# Patient Record
Sex: Male | Born: 1985 | Race: White | Hispanic: No | Marital: Single | Smoking: Current every day smoker
Health system: Southern US, Community
[De-identification: ages and names within clinical notes are randomized; demographics above are authoritative.]

---

## 2012-02-21 ENCOUNTER — Emergency Department (HOSPITAL_COMMUNITY): Payer: No Typology Code available for payment source

## 2012-02-21 ENCOUNTER — Encounter (HOSPITAL_COMMUNITY): Payer: Self-pay | Admitting: Emergency Medicine

## 2012-02-21 ENCOUNTER — Emergency Department (HOSPITAL_COMMUNITY)
Admission: EM | Admit: 2012-02-21 | Discharge: 2012-02-21 | Disposition: A | Discharge status: Home / Self Care | Payer: No Typology Code available for payment source | Attending: Emergency Medicine | Admitting: Emergency Medicine

## 2012-02-21 DIAGNOSIS — R109 Unspecified abdominal pain: Secondary | ICD-10-CM | POA: Insufficient documentation

## 2012-02-21 DIAGNOSIS — M542 Cervicalgia: Secondary | ICD-10-CM | POA: Insufficient documentation

## 2012-02-21 DIAGNOSIS — Y92410 Unspecified street and highway as the place of occurrence of the external cause: Secondary | ICD-10-CM | POA: Insufficient documentation

## 2012-02-21 DIAGNOSIS — M79609 Pain in unspecified limb: Secondary | ICD-10-CM | POA: Insufficient documentation

## 2012-02-21 DIAGNOSIS — R51 Headache: Secondary | ICD-10-CM | POA: Insufficient documentation

## 2012-02-21 MED ORDER — HYDROCODONE-ACETAMINOPHEN 5-325 MG PO TABS: 1.0000 | ORAL_TABLET | Freq: Four times a day (QID) | ORAL | Status: AC | PRN

## 2012-02-21 MED ORDER — IBUPROFEN 200 MG PO TABS
600.0000 mg | ORAL_TABLET | Freq: Once | ORAL | Status: AC
  Administered 2012-02-21: 600 mg via ORAL
  Filled 2012-02-21: qty 3

## 2012-02-21 MED ORDER — OXYCODONE-ACETAMINOPHEN 5-325 MG PO TABS
1.0000 | ORAL_TABLET | Freq: Once | ORAL | Status: AC
  Administered 2012-02-21: 1 via ORAL
  Filled 2012-02-21: qty 1

## 2012-02-21 MED ORDER — IOHEXOL 300 MG/ML  SOLN
100.0000 mL | Freq: Once | INTRAMUSCULAR | Status: AC | PRN
  Administered 2012-02-21: 100 mL via INTRAVENOUS

## 2012-02-21 MED ORDER — DIAZEPAM 5 MG PO TABS: 5.0000 mg | ORAL_TABLET | Freq: Three times a day (TID) | ORAL | Status: AC | PRN

## 2012-02-21 MED ORDER — IBUPROFEN 800 MG PO TABS: 800.0000 mg | ORAL_TABLET | Freq: Three times a day (TID) | ORAL | Status: AC

## 2012-02-21 MED ORDER — DIAZEPAM 5 MG PO TABS
5.0000 mg | ORAL_TABLET | Freq: Once | ORAL | Status: AC
  Administered 2012-02-21: 5 mg via ORAL
  Filled 2012-02-21: qty 1
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
## 2012-02-21 NOTE — ED Notes (Signed)
Pt ambulates without distress.  
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
## 2012-02-22 NOTE — ED Provider Notes (Signed)
Medical screening examination/treatment/procedure(s) were performed by non-physician practitioner and as supervising physician I was immediately available for consultation/collaboration.  Geoffery Lyons, MD 02/22/12 [phone]

## 2017-05-07 ENCOUNTER — Emergency Department (HOSPITAL_COMMUNITY)
Admission: EM | Admit: 2017-05-07 | Discharge: 2017-05-07 | Disposition: A | Discharge status: Home / Self Care | Payer: Self-pay | Attending: Emergency Medicine | Admitting: Emergency Medicine

## 2017-05-07 ENCOUNTER — Encounter (HOSPITAL_COMMUNITY): Payer: Self-pay | Admitting: *Deleted

## 2017-05-07 ENCOUNTER — Emergency Department (HOSPITAL_COMMUNITY): Payer: Self-pay

## 2017-05-07 DIAGNOSIS — K5792 Diverticulitis of intestine, part unspecified, without perforation or abscess without bleeding: Secondary | ICD-10-CM

## 2017-05-07 DIAGNOSIS — K5732 Diverticulitis of large intestine without perforation or abscess without bleeding: Secondary | ICD-10-CM | POA: Insufficient documentation

## 2017-05-07 DIAGNOSIS — F17210 Nicotine dependence, cigarettes, uncomplicated: Secondary | ICD-10-CM | POA: Insufficient documentation

## 2017-05-07 LAB — CBC
HCT: 44.3 % (ref 39.0–52.0)
Hemoglobin: 15.5 g/dL (ref 13.0–17.0)
MCH: 30 pg (ref 26.0–34.0)
MCHC: 35 g/dL (ref 30.0–36.0)
MCV: 85.9 fL (ref 78.0–100.0)
Platelets: 225 10*3/uL (ref 150–400)
RBC: 5.16 MIL/uL (ref 4.22–5.81)
RDW: 12.8 % (ref 11.5–15.5)
WBC: 10.9 10*3/uL — ABNORMAL HIGH (ref 4.0–10.5)

## 2017-05-07 LAB — BASIC METABOLIC PANEL
Anion gap: 7 (ref 5–15)
BUN: 10 mg/dL (ref 6–20)
CO2: 25 mmol/L (ref 22–32)
Calcium: 9.4 mg/dL (ref 8.9–10.3)
Chloride: 105 mmol/L (ref 101–111)
Creatinine, Ser: 1.04 mg/dL (ref 0.61–1.24)
GFR calc Af Amer: >60 mL/min (ref >60)
GFR calc non Af Amer: >60 mL/min (ref >60)
Glucose, Bld: 109 mg/dL — ABNORMAL HIGH (ref 65–99)
Potassium: 3.6 mmol/L (ref 3.5–5.1)
Sodium: 137 mmol/L (ref 135–145)

## 2017-05-07 MED ORDER — DIPHENHYDRAMINE HCL 50 MG/ML IJ SOLN
25.0000 mg | Freq: Once | INTRAMUSCULAR | Status: AC
  Administered 2017-05-07: 25 mg via INTRAVENOUS
  Filled 2017-05-07: qty 1

## 2017-05-07 MED ORDER — KETOROLAC TROMETHAMINE 30 MG/ML IJ SOLN
30.0000 mg | Freq: Once | INTRAMUSCULAR | Status: AC
  Administered 2017-05-07: 30 mg via INTRAVENOUS
  Filled 2017-05-07: qty 1

## 2017-05-07 MED ORDER — TRAMADOL HCL 50 MG PO TABS: 50.0000 mg | ORAL_TABLET | Freq: Four times a day (QID) | ORAL | 0 refills | Status: DC | PRN

## 2017-05-07 MED ORDER — HYDROMORPHONE HCL 1 MG/ML IJ SOLN
1.0000 mg | Freq: Once | INTRAMUSCULAR | Status: DC
Start: 2017-05-07 — End: 2017-05-07
  Filled 2017-05-07: qty 1

## 2017-05-07 MED ORDER — CIPROFLOXACIN IN D5W 400 MG/200ML IV SOLN
400.0000 mg | Freq: Once | INTRAVENOUS | Status: AC
  Administered 2017-05-07: 400 mg via INTRAVENOUS
  Filled 2017-05-07: qty 200

## 2017-05-07 MED ORDER — METRONIDAZOLE IN NACL 5-0.79 MG/ML-% IV SOLN
500.0000 mg | Freq: Once | INTRAVENOUS | Status: DC
  Filled 2017-05-07: qty 100

## 2017-05-07 MED ORDER — IOPAMIDOL (ISOVUE-300) INJECTION 61%
INTRAVENOUS | Status: AC
  Filled 2017-05-07: qty 100

## 2017-05-07 MED ORDER — ONDANSETRON HCL 4 MG/2ML IJ SOLN
4.0000 mg | Freq: Once | INTRAMUSCULAR | Status: DC
  Filled 2017-05-07: qty 2

## 2017-05-07 MED ORDER — IOPAMIDOL (ISOVUE-300) INJECTION 61%
100.0000 mL | Freq: Once | INTRAVENOUS | Status: AC | PRN
  Administered 2017-05-07: 100 mL via INTRAVENOUS

## 2017-05-07 MED ORDER — AMOXICILLIN-POT CLAVULANATE 875-125 MG PO TABS: 1.0000 | ORAL_TABLET | Freq: Two times a day (BID) | ORAL | 0 refills | Status: DC

## 2017-05-07 NOTE — ED Notes (Signed)
Patient was alert, oriented and stable upon discharge. RN went over AVS and patient had no further questions.  

## 2017-05-07 NOTE — ED Notes (Signed)
ED Provider at bedside. 

## 2017-05-07 NOTE — ED Triage Notes (Signed)
Patient is alert and oriented x4.  He is being seen for right flank pain that started a week ago and progressively has gotten worse.  Patient denies any fevers, nausea or vomting.  Currently he rates his pain 10 of 10.

## 2017-05-07 NOTE — Discharge Instructions (Signed)
Return here as needed.  Follow-up with the, Dr. provided °

## 2017-05-07 NOTE — ED Notes (Signed)
Redness and hives noted above IV site. Cipro stopped. PA notified. No airway involvement.

## 2017-05-07 NOTE — ED Notes (Signed)
Pt refused meds stating he "doesn't want to go to sleep." PA made aware. Pt transported to CT

## 2017-05-07 NOTE — ED Notes (Signed)
Patient was reminded that a urine sample was requested.

## 2017-05-09 NOTE — ED Provider Notes (Signed)
[facility]-EMERGENCY DEPT Provider Note   CSN: [id number] Arrival date & time: 05/07/17  0734     History   Chief Complaint Chief Complaint  Patient presents with  . Flank Pain    HPI Thomas Barnett is a 31 y.o. male.  HPI Patient presents to the emergency department with right flank pain that started one week ago.  The patient states he seems to have had worsening pain over that time.  Patient states that he does not think he had any fevers.  He states he did not take any medications prior to arrival.  States that he has never had any pain similar to this in the past.  Patient states he does have some abdominal pain along the lower part of his abdomen as well both on the right in the left. The patient denies chest pain, shortness of breath, headache,blurred vision, neck pain, fever, cough, weakness, numbness, dizziness, anorexia, edema,  nausea, vomiting, diarrhea, rash, back pain, dysuria, hematemesis, bloody stool, near syncope, or syncope. History reviewed. No pertinent past medical history.  There are no active problems to display for this patient.   History reviewed. No pertinent surgical history.     Home Medications    Prior to Admission medications   Medication Sig Start Date End Date Taking? Authorizing Provider  Aspirin-Acetaminophen-Caffeine (GOODY HEADACHE PO) Take 1 packet by mouth daily as needed (pain).   Yes [provider]  ibuprofen (ADVIL,MOTRIN) 200 MG tablet Take 600-800 mg by mouth every 4 (four) hours as needed for moderate pain.   Yes [provider]  amoxicillin-clavulanate (AUGMENTIN) 875-125 MG tablet Take 1 tablet by mouth every 12 (twelve) hours. 05/07/17   Cedricka Sackrider, Cristal Deer, PA-C  traMADol (ULTRAM) 50 MG tablet Take 1 tablet (50 mg total) by mouth every 6 (six) hours as needed for severe pain. 05/07/17   Charlestine Night, PA-C    Family History No family history on file.  Social History Social History  Substance Use Topics  .  Smoking status: Current Every Day Smoker    Packs/day: 0.50    Types: Cigarettes  . Smokeless tobacco: Never Used  . Alcohol use Yes     Allergies   Ciprofloxacin   Review of Systems Review of Systems All other systems negative except as documented in the HPI. All pertinent positives and negatives as reviewed in the HPI.  Physical Exam Updated Vital Signs BP 117/70 (BP Location: Right Arm)   Pulse (!) 56   Temp 98.1 F (36.7 C) (Oral)   Resp 16   Ht 5\' 6"  (1.676 m)   Wt 65.8 kg (145 lb)   SpO2 98%   BMI 23.40 kg/m   Physical Exam  Constitutional: He is oriented to person, place, and time. He appears well-developed and well-nourished. No distress.  HENT:  Head: Normocephalic and atraumatic.  Mouth/Throat: Oropharynx is clear and moist.  Eyes: Pupils are equal, round, and reactive to light.  Neck: Normal range of motion. Neck supple.  Cardiovascular: Normal rate, regular rhythm and normal heart sounds.  Exam reveals no gallop and no friction rub.   No murmur heard. Pulmonary/Chest: Effort normal and breath sounds normal. No respiratory distress. He has no wheezes.  Abdominal: Soft. Bowel sounds are normal. He exhibits no distension and no mass. There is tenderness. There is no rebound and no guarding.    Neurological: He is alert and oriented to person, place, and time. He exhibits normal muscle tone. Coordination normal.  Skin: Skin is warm and  dry. Capillary refill takes less than 2 seconds. No rash noted. No erythema.  Psychiatric: He has a normal mood and affect. His behavior is normal.  Nursing note and vitals reviewed.    ED Treatments / Results  Labs (all labs ordered are listed, but only abnormal results are displayed) Labs Reviewed  BASIC METABOLIC PANEL - Abnormal; Notable for the following:       Result Value   Glucose, Bld 109 (*)    All other components within normal limits  CBC - Abnormal; Notable for the following:    WBC 10.9 (*)    All other  components within normal limits    EKG  EKG Interpretation None       Radiology No results found.  Procedures Procedures (including critical care time)  Medications Ordered in ED Medications  iopamidol (ISOVUE-300) 61 % injection 100 mL (100 mLs Intravenous Contrast Given 05/07/17 0921)  ketorolac (TORADOL) 30 MG/ML injection 30 mg (30 mg Intravenous Given 05/07/17 1155)  ciprofloxacin (CIPRO) IVPB 400 mg (0 mg Intravenous Stopped 05/07/17 1210)  diphenhydrAMINE (BENADRYL) injection 25 mg (25 mg Intravenous Given 05/07/17 1215)     Initial Impression / Assessment and Plan / ED Course  I have reviewed the triage vital signs and the nursing notes.  Pertinent labs & imaging results that were available during my care of the patient were reviewed by me and considered in my medical decision making (see chart for details).     Patient has findings consistent with early diverticulitis.  There is no sign of abscess or perforation.  The patient will be started on antibiotics.  He is given fluids here in the emergency department.  Patient would like to be discharged home.  I did given follow-up with GI for further evaluation and care.  Patient is advised to return here for any worsening in his condition  Final Clinical Impressions(s) / ED Diagnoses   Final diagnoses:  Diverticulitis    New Prescriptions Discharge Medication List as of 05/07/2017 12:31 PM    START taking these medications   Details  amoxicillin-clavulanate (AUGMENTIN) 875-125 MG tablet Take 1 tablet by mouth every 12 (twelve) hours., Starting Fri 05/07/2017, Print    traMADol (ULTRAM) 50 MG tablet Take 1 tablet (50 mg total) by mouth every 6 (six) hours as needed for severe pain., Starting Fri 05/07/2017, Print         Baraa Tubbs, [place], PA-C 05/09/17 1625    Raeford Razor, MD 05/18/17 [phone]

## 2019-11-19 ENCOUNTER — Encounter (HOSPITAL_COMMUNITY): Payer: Self-pay

## 2019-11-19 ENCOUNTER — Emergency Department (HOSPITAL_COMMUNITY)
Admission: EM | Admit: 2019-11-19 | Discharge: 2019-11-19 | Disposition: A | Discharge status: Home / Self Care | Payer: Self-pay | Attending: Emergency Medicine | Admitting: Emergency Medicine

## 2019-11-19 ENCOUNTER — Other Ambulatory Visit: Payer: Self-pay

## 2019-11-19 DIAGNOSIS — F17210 Nicotine dependence, cigarettes, uncomplicated: Secondary | ICD-10-CM | POA: Insufficient documentation

## 2019-11-19 DIAGNOSIS — M25572 Pain in left ankle and joints of left foot: Secondary | ICD-10-CM | POA: Insufficient documentation

## 2019-11-19 DIAGNOSIS — M25552 Pain in left hip: Secondary | ICD-10-CM | POA: Insufficient documentation
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
## 2020-10-09 ENCOUNTER — Ambulatory Visit (HOSPITAL_COMMUNITY)
Admission: EM | Admit: 2020-10-09 | Discharge: 2020-10-09 | Discharge status: Against Medical Advice | Payer: No Typology Code available for payment source

## 2020-10-09 ENCOUNTER — Other Ambulatory Visit: Payer: Self-pay

## 2020-10-09 ENCOUNTER — Ambulatory Visit
Admission: EM | Admit: 2020-10-09 | Discharge: 2020-10-09 | Disposition: A | Discharge status: Home / Self Care | Payer: Self-pay | Attending: Emergency Medicine | Admitting: Emergency Medicine

## 2020-10-09 ENCOUNTER — Encounter: Payer: Self-pay | Admitting: Emergency Medicine

## 2020-10-09 DIAGNOSIS — M545 Low back pain, unspecified: Secondary | ICD-10-CM

## 2020-10-09 DIAGNOSIS — R1030 Lower abdominal pain, unspecified: Secondary | ICD-10-CM

## 2020-10-09 MED ORDER — TIZANIDINE HCL 4 MG PO TABS: 4.0000 mg | ORAL_TABLET | Freq: Four times a day (QID) | ORAL | 0 refills | Status: AC | PRN

## 2020-10-09 MED ORDER — IBUPROFEN 800 MG PO TABS: 800.0000 mg | ORAL_TABLET | Freq: Three times a day (TID) | ORAL | 0 refills | Status: AC
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
## 2020-10-09 NOTE — Discharge Instructions (Signed)
Use anti-inflammatories for pain/swelling. You may take up to 800 mg Ibuprofen every 8 hours with food. You may supplement Ibuprofen with Tylenol [phone] mg every 8 hours.   You may use tizanidine as needed to help with pain. This is a muscle relaxer and causes sedation- please use only at bedtime or when you will be home and not have to drive/work  Alternate ice and heat  Gentle stretching  Follow-up if not improving or worsening
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
## 2020-10-09 NOTE — ED Notes (Signed)
Patient approached desk and asked to be taken out of system due to wait time.

## 2021-04-02 ENCOUNTER — Emergency Department (HOSPITAL_COMMUNITY): Payer: Self-pay

## 2021-04-02 ENCOUNTER — Emergency Department (HOSPITAL_COMMUNITY)
Admission: EM | Admit: 2021-04-02 | Discharge: 2021-04-02 | Discharge status: Against Medical Advice | Payer: Self-pay | Attending: Emergency Medicine | Admitting: Emergency Medicine

## 2021-04-02 ENCOUNTER — Encounter (HOSPITAL_COMMUNITY): Payer: Self-pay | Admitting: *Deleted

## 2021-04-02 ENCOUNTER — Other Ambulatory Visit: Payer: Self-pay

## 2021-04-02 DIAGNOSIS — K529 Noninfective gastroenteritis and colitis, unspecified: Secondary | ICD-10-CM | POA: Insufficient documentation

## 2021-04-02 DIAGNOSIS — F17210 Nicotine dependence, cigarettes, uncomplicated: Secondary | ICD-10-CM | POA: Insufficient documentation

## 2021-04-02 LAB — CBC WITH DIFFERENTIAL/PLATELET
Abs Immature Granulocytes: 0.04 10*3/uL (ref 0.00–0.07)
Basophils Absolute: 0 10*3/uL (ref 0.0–0.1)
Basophils Relative: 0 %
Eosinophils Absolute: 0 10*3/uL (ref 0.0–0.5)
Eosinophils Relative: 0 %
HCT: 46.9 % (ref 39.0–52.0)
Hemoglobin: 16.2 g/dL (ref 13.0–17.0)
Immature Granulocytes: 0 %
Lymphocytes Relative: 8 %
Lymphs Abs: 1.1 10*3/uL (ref 0.7–4.0)
MCH: 29.4 pg (ref 26.0–34.0)
MCHC: 34.5 g/dL (ref 30.0–36.0)
MCV: 85.1 fL (ref 80.0–100.0)
Monocytes Absolute: 0.4 10*3/uL (ref 0.1–1.0)
Monocytes Relative: 3 %
Neutro Abs: 13.4 10*3/uL — ABNORMAL HIGH (ref 1.7–7.7)
Neutrophils Relative %: 89 %
Platelets: 313 10*3/uL (ref 150–400)
RBC: 5.51 MIL/uL (ref 4.22–5.81)
RDW: 12.5 % (ref 11.5–15.5)
WBC: 15.1 10*3/uL — ABNORMAL HIGH (ref 4.0–10.5)
nRBC: 0 % (ref 0.0–0.2)

## 2021-04-02 LAB — COMPREHENSIVE METABOLIC PANEL
ALT: 31 U/L (ref 0–44)
AST: 31 U/L (ref 15–41)
Albumin: 4.5 g/dL (ref 3.5–5.0)
Alkaline Phosphatase: 75 U/L (ref 38–126)
Anion gap: 11 (ref 5–15)
BUN: 14 mg/dL (ref 6–20)
CO2: 24 mmol/L (ref 22–32)
Calcium: 9.5 mg/dL (ref 8.9–10.3)
Chloride: 102 mmol/L (ref 98–111)
Creatinine, Ser: 0.8 mg/dL (ref 0.61–1.24)
GFR, Estimated: >60 mL/min (ref >60)
Glucose, Bld: 110 mg/dL — ABNORMAL HIGH (ref 70–99)
Potassium: 3.6 mmol/L (ref 3.5–5.1)
Sodium: 137 mmol/L (ref 135–145)
Total Bilirubin: 0.7 mg/dL (ref 0.3–1.2)
Total Protein: 7.5 g/dL (ref 6.5–8.1)

## 2021-04-02 LAB — URINALYSIS, ROUTINE W REFLEX MICROSCOPIC
Bilirubin Urine: NEGATIVE
Glucose, UA: NEGATIVE mg/dL
Hgb urine dipstick: NEGATIVE
Ketones, ur: NEGATIVE mg/dL
Leukocytes,Ua: NEGATIVE
Nitrite: NEGATIVE
Protein, ur: NEGATIVE mg/dL
Specific Gravity, Urine: 1.006 (ref 1.005–1.030)
pH: 8 (ref 5.0–8.0)

## 2021-04-02 LAB — LIPASE, BLOOD: Lipase: 21 U/L (ref 11–51)

## 2021-04-02 MED ORDER — LORAZEPAM 2 MG/ML IJ SOLN
1.0000 mg | Freq: Once | INTRAMUSCULAR | Status: AC
  Administered 2021-04-02: 1 mg via INTRAVENOUS
  Filled 2021-04-02: qty 1

## 2021-04-02 MED ORDER — IOHEXOL 350 MG/ML SOLN
80.0000 mL | Freq: Once | INTRAVENOUS | Status: AC | PRN
  Administered 2021-04-02: 80 mL via INTRAVENOUS

## 2021-04-02 MED ORDER — ONDANSETRON HCL 4 MG/2ML IJ SOLN
4.0000 mg | Freq: Once | INTRAMUSCULAR | Status: AC
  Administered 2021-04-02: 4 mg via INTRAVENOUS
  Filled 2021-04-02: qty 2

## 2021-04-02 MED ORDER — SODIUM CHLORIDE 0.9 % IV SOLN: INTRAVENOUS | Status: DC

## 2021-04-02 MED ORDER — SODIUM CHLORIDE 0.9 % IV BOLUS
1000.0000 mL | Freq: Once | INTRAVENOUS | Status: AC
  Administered 2021-04-02: 1000 mL via INTRAVENOUS

## 2021-04-02 MED ORDER — SODIUM CHLORIDE (PF) 0.9 % IJ SOLN
INTRAMUSCULAR | Status: AC
  Filled 2021-04-02: qty 50

## 2021-04-02 NOTE — ED Triage Notes (Addendum)
Pt complains of vomiting and diarrhea. He reports going through opiate withdrawal. Last used heroin 5 days ago. He finished a detox program today.

## 2021-04-02 NOTE — ED Provider Notes (Addendum)
[facility]-EMERGENCY DEPT Provider Note   CSN: [id number] Arrival date & time: 04/02/21  1319     History Chief Complaint  Patient presents with   Emesis   Diarrhea   opiate withdrawal    Thomas Barnett is a 35 y.o. male.  Patient just spent 5 days in our Detox center for opiate abuse.  Patient states he was feeling fine at the time of release yesterday.  When he got home he started to feel bad with nausea vomiting and diarrhea.  No blood.  Opiate use last was 5 days ago.      History reviewed. No pertinent past medical history.  There are no problems to display for this patient.   History reviewed. No pertinent surgical history.     No family history on file.  Social History   Tobacco Use   Smoking status: Every Day    Packs/day: 0.50    Pack years: 0.00    Types: Cigarettes   Smokeless tobacco: Never  Substance Use Topics   Alcohol use: Yes   Drug use: No    Home Medications Prior to Admission medications   Medication Sig Start Date End Date Taking? Authorizing Provider  Aspirin-Acetaminophen-Caffeine (GOODY HEADACHE PO) Take 1 packet by mouth daily as needed (pain).    [provider]  ibuprofen (ADVIL) 800 MG tablet Take 1 tablet (800 mg total) by mouth 3 (three) times daily. 10/09/20   Wieters, Hallie C, PA-C  tiZANidine (ZANAFLEX) 4 MG tablet Take 1 tablet (4 mg total) by mouth every 6 (six) hours as needed for muscle spasms. 10/09/20   Wieters, Hallie C, PA-C    Allergies    Ciprofloxacin  Review of Systems   Review of Systems  Constitutional:  Negative for chills and fever.  HENT:  Negative for ear pain and sore throat.   Eyes:  Negative for pain and visual disturbance.  Respiratory:  Negative for cough and shortness of breath.   Cardiovascular:  Negative for chest pain and palpitations.  Gastrointestinal:  Positive for abdominal pain, diarrhea, nausea and vomiting.  Genitourinary:  Negative for dysuria and  hematuria.  Musculoskeletal:  Negative for arthralgias and back pain.  Skin:  Negative for color change and rash.  Neurological:  Negative for seizures and syncope.  All other systems reviewed and are negative.  Physical Exam Updated Vital Signs BP 109/70   Pulse 84   Temp 98 F (36.7 C) (Oral)   Resp 18   SpO2 99%   Physical Exam Vitals and nursing note reviewed.  Constitutional:      General: He is not in acute distress.    Appearance: Normal appearance. He is well-developed. He is not diaphoretic.  HENT:     Head: Normocephalic and atraumatic.  Eyes:     Extraocular Movements: Extraocular movements intact.     Conjunctiva/sclera: Conjunctivae normal.     Pupils: Pupils are equal, round, and reactive to light.  Cardiovascular:     Rate and Rhythm: Normal rate and regular rhythm.     Heart sounds: No murmur heard. Pulmonary:     Effort: Pulmonary effort is normal. No respiratory distress.     Breath sounds: Normal breath sounds.  Abdominal:     Palpations: Abdomen is soft.     Tenderness: There is no abdominal tenderness.  Musculoskeletal:        General: Normal range of motion.     Cervical back: Normal range of motion and neck supple.  Skin:    General: Skin is warm and dry.     Capillary Refill: Capillary refill takes less than 2 seconds.  Neurological:     General: No focal deficit present.     Mental Status: He is alert and oriented to person, place, and time.     Cranial Nerves: No cranial nerve deficit.     Sensory: No sensory deficit.    ED Results / Procedures / Treatments   Labs (all labs ordered are listed, but only abnormal results are displayed) Labs Reviewed  CBC WITH DIFFERENTIAL/PLATELET  COMPREHENSIVE METABOLIC PANEL  LIPASE, BLOOD    EKG None  Radiology No results found.  Procedures Procedures   Medications Ordered in ED Medications  0.9 %  sodium chloride infusion ( Intravenous New Bag/Given 04/02/21 1450)  sodium chloride 0.9 %  bolus 1,000 mL (1,000 mLs Intravenous New Bag/Given 04/02/21 1451)  ondansetron (ZOFRAN) injection 4 mg (4 mg Intravenous Given 04/02/21 1451)    ED Course  I have reviewed the triage vital signs and the nursing notes.  Pertinent labs & imaging results that were available during my care of the patient were reviewed by me and considered in my medical decision making (see chart for details).    MDM Rules/Calculators/A&P                          It is interesting the patient was feeling fine at time of discharge.  Not certain that this is related to opiate withdrawal.  May have come down with a gastroenteritis.  We will give some IV fluids Zofran and acute abdominal series and then recheck.  Wound of expected patient to be be feeling completely fine yesterday and then have a relapse today.  If it was due to opiate withdrawal. Final Clinical Impression(s) / ED Diagnoses Final diagnoses:  Gastroenteritis    Rx / DC Orders ED Discharge Orders     None        Vanetta Mulders, MD 04/02/21 1452  Addendum: Patient refused the plain x-rays but is in agreement to do CT scan of the abdomen.  We will give him some Ativan to calm his nerves.  Patient's had diverticulitis in the past.  Clinically were still thinking that he has developed something new since he felt fine during detox.    Vanetta Mulders, MD 04/02/21 1537

## 2021-04-02 NOTE — ED Provider Notes (Signed)
Assumed care of patient from prior emergency department provider.  This is a 35 year old male who recently completed 5-day of detox for opioid abuse, presenting to the ED with new onset of abdominal pain nausea and vomiting from this morning.  Also porting diarrhea.  He denies any recent opioid use.  In the ED he is afebrile, mildly tachycardic, blood pressure has been stable.  Blood work showing leukocytosis of 15,000, otherwise unremarkable CMP.  Per earlier provider's assessment, the patient is now pending a CT scan of the abdomen pelvis.  Received zofran and ativan for nausea and anxiety.  Patient left AGAINST MEDICAL ADVICE prior to the completion of his work-up.  I reviewed his CT films which did not show any acute findings to explain his nausea or vomiting.    Terald Sleeper, MD 04/02/21 [phone]

## 2021-12-10 IMAGING — CT CT ABD-PELV W/ CM
2 of 4 series · 16 of 46 positions shown, 18 images · IV contrast (OMNIPAQUE 350)
Comparison: CT scan 05/07/2017

CLINICAL DATA: Nausea, vomiting and abdominal pain.

EXAM:
CT ABDOMEN AND PELVIS WITH CONTRAST
TECHNIQUE: Multidetector CT imaging of the abdomen and pelvis was performed
using the standard protocol following bolus administration of
intravenous contrast.
CONTRAST:  80mL OMNIPAQUE IOHEXOL 350 MG/ML SOLN

[Series 2: axial st · axial · 0.76mm/px · z∈[-588,-203]mm · 13 of 89 slices shown, 15 images]
[im 6/89  soft-tissue]
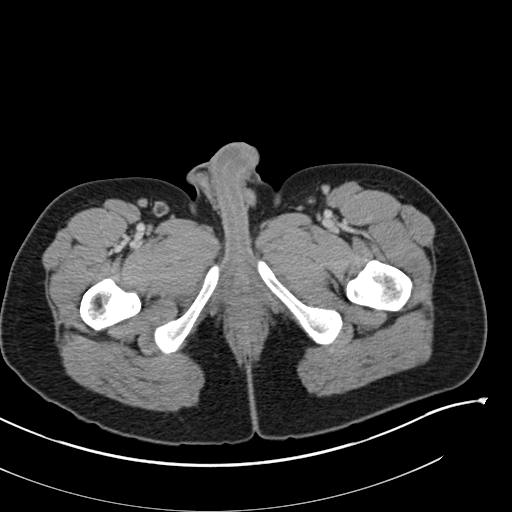
[im 6/89  bone]
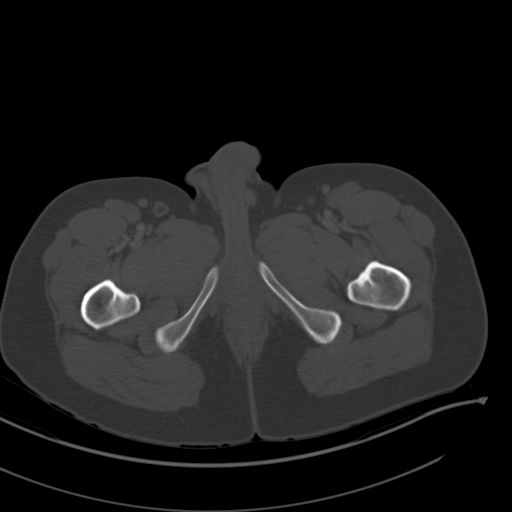
[im 11/89  soft-tissue]
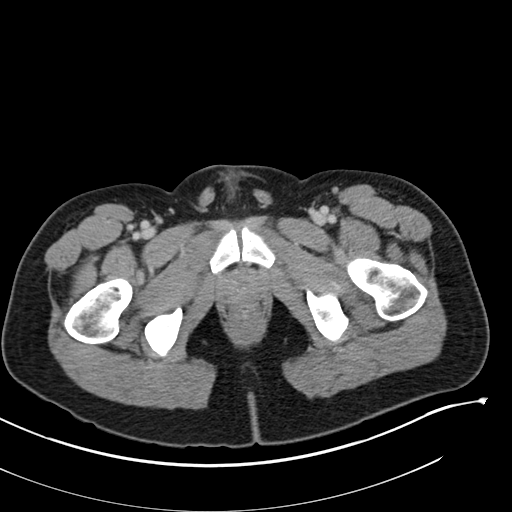
[im 21/89  soft-tissue]
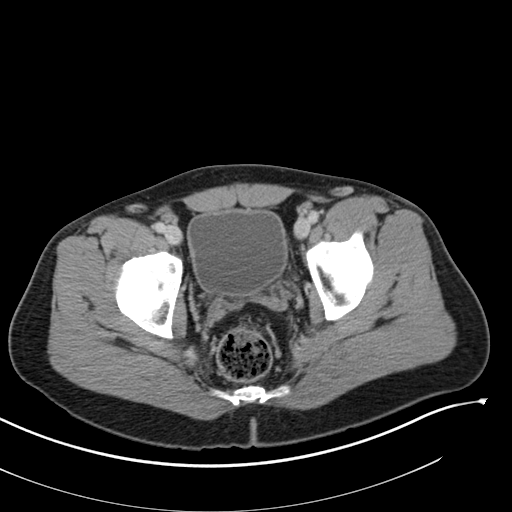
[im 26/89  soft-tissue]
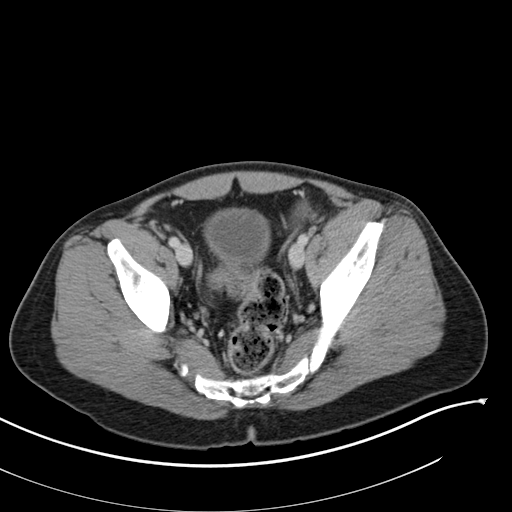
[im 32/89  soft-tissue]
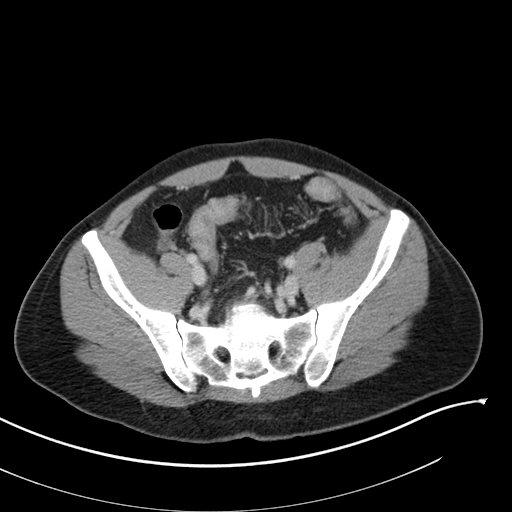
[im 37/89  soft-tissue]
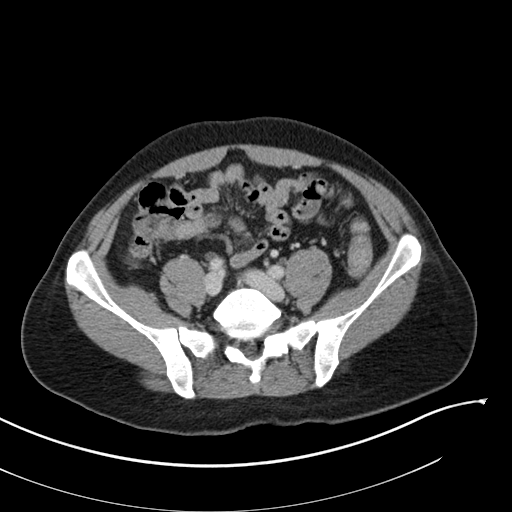
[im 47/89  soft-tissue]
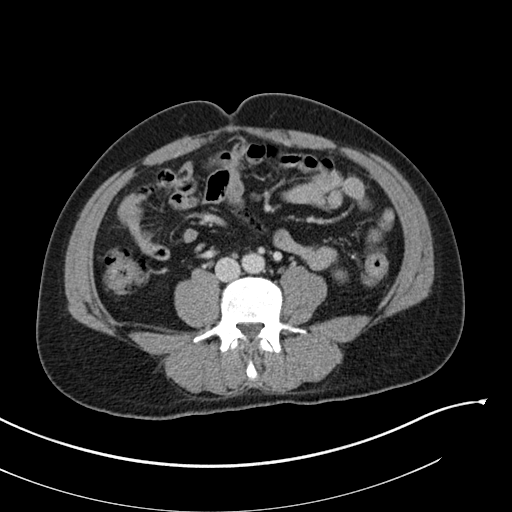
[im 52/89  soft-tissue]
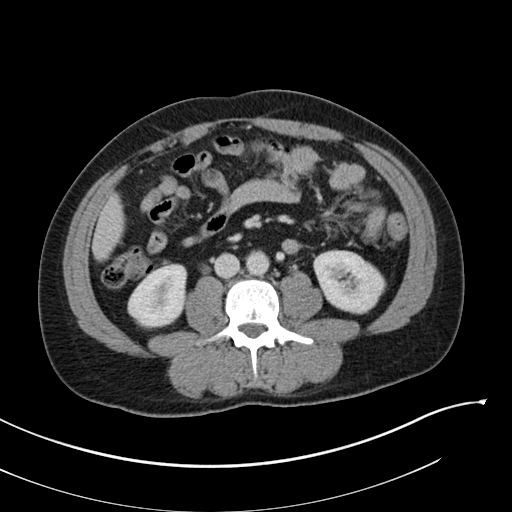
[im 57/89  soft-tissue]
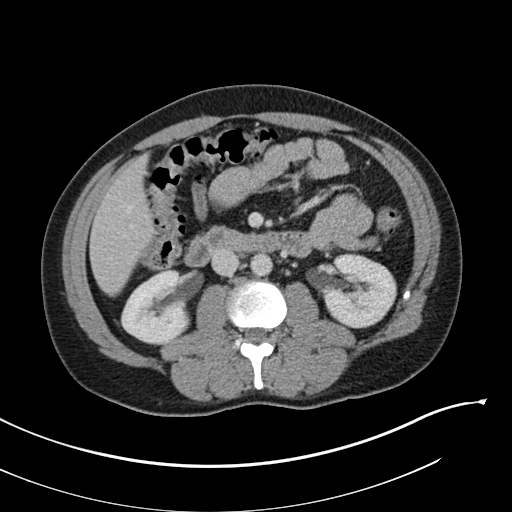
[im 57/89  bone]
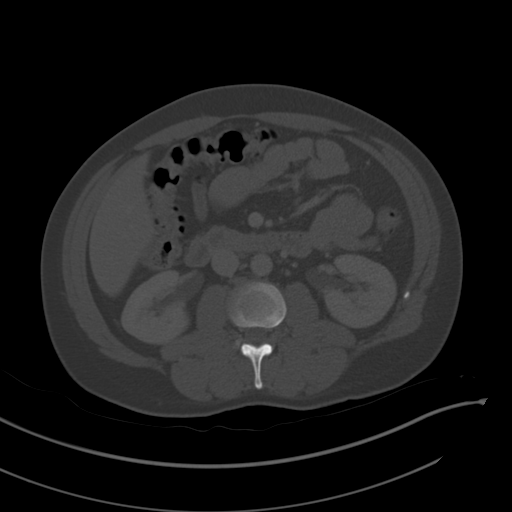
[im 63/89  soft-tissue]
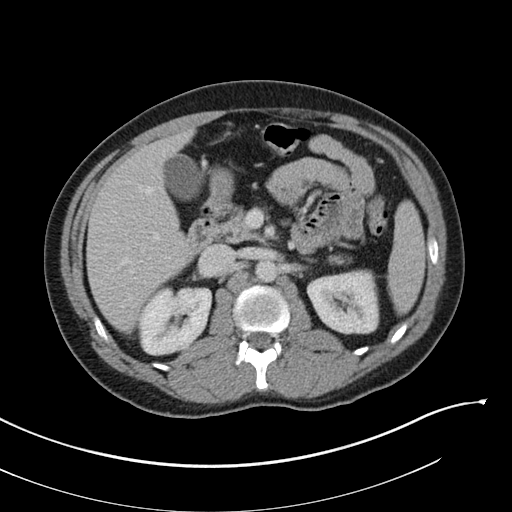
[im 68/89  soft-tissue]
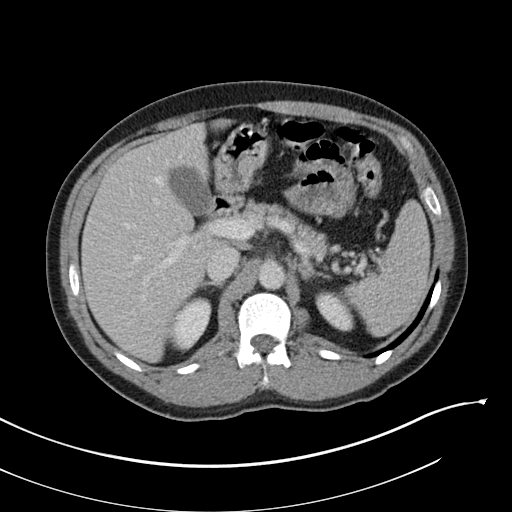
[im 78/89  soft-tissue]
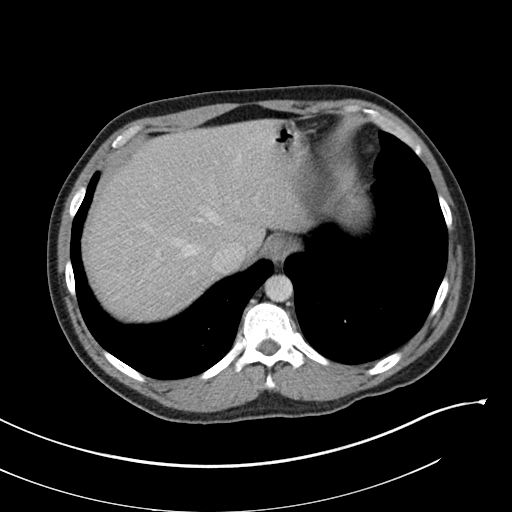
[im 83/89  soft-tissue]
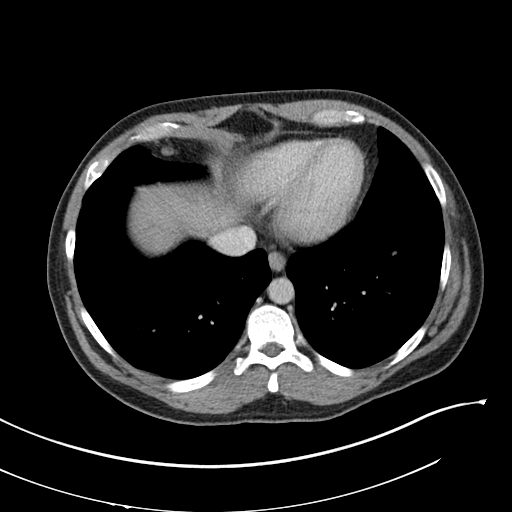

[Series 5: coronal st · coronal · 0.67mm/px · 3 of 151 slices shown]
[im 51/151  soft-tissue]
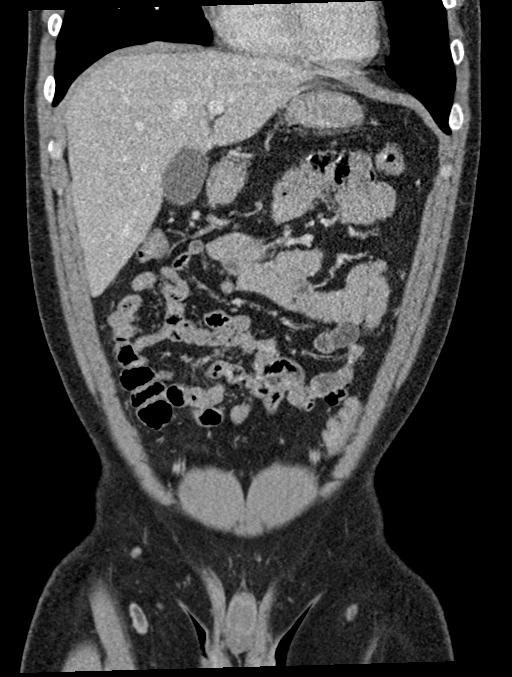
[im 67/151  soft-tissue]
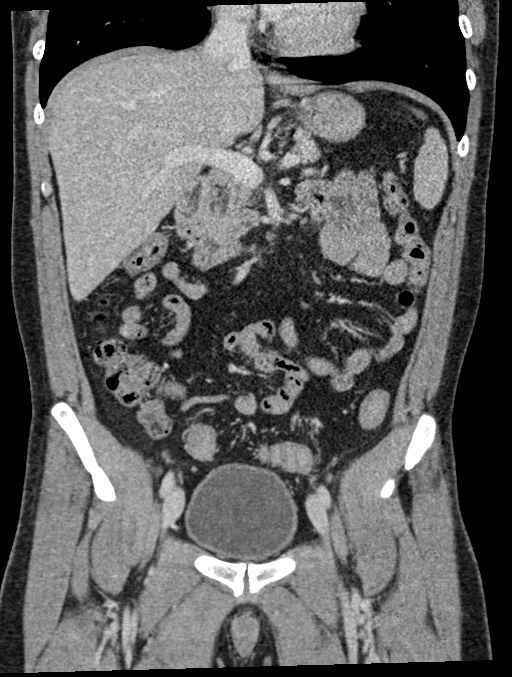
[im 84/151  soft-tissue]
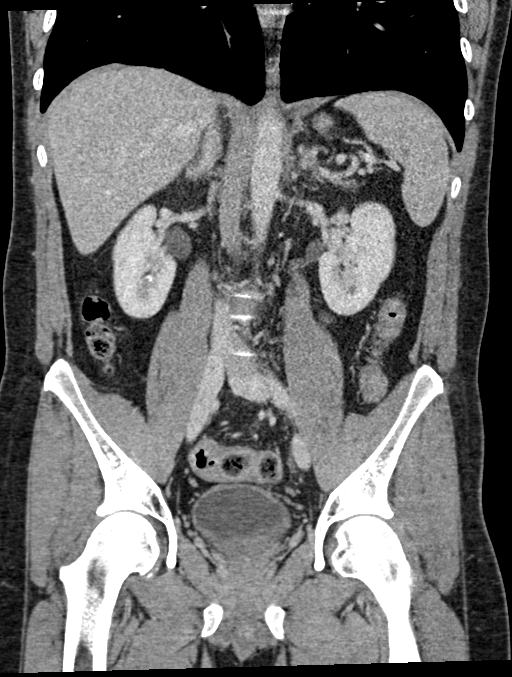

[16 of 46 positions shown; findings below may reference images not displayed]

FINDINGS: Lower chest: The lung bases are clear of acute process. No pleural
effusion or pulmonary lesions. The heart is normal in size. No
pericardial effusion. The distal esophagus and aorta are
unremarkable.

Hepatobiliary: No focal hepatic lesions or intrahepatic biliary
dilatation. The gallbladder is normal. No common bile duct
dilatation.

Pancreas: No mass, inflammation or ductal dilatation.

Spleen: Normal size.  Small calcified granulomas are noted.

Adrenals/Urinary Tract: The adrenal glands are unremarkable.

Small lower pole right renal calculus. No obstructing ureteral
calculi or bladder calculi. No worrisome renal or bladder lesions
are identified.

Stomach/Bowel: The stomach, duodenum, small bowel and colon are
grossly normal without oral contrast. No inflammatory changes, mass
lesions or obstructive findings. The appendix is normal.

Vascular/Lymphatic: The aorta is normal in caliber. No dissection.
The branch vessels are patent. The major venous structures are
patent. No mesenteric or retroperitoneal mass or adenopathy. Small
scattered lymph nodes are noted.

Reproductive: The prostate gland and seminal vesicles are
unremarkable.

Other: No pelvic mass or adenopathy. No free pelvic fluid
collections. No inguinal mass or adenopathy. No abdominal wall
hernia or subcutaneous lesions.

Musculoskeletal: No significant bony findings.
IMPRESSION: 1. No acute abdominal/pelvic findings, mass lesions or adenopathy.
2. Small lower pole right renal calculus but no obstructing ureteral
calculi or bladder calculi.
